# Patient Record
Sex: Male | Born: 1980 | Race: Black or African American | Hispanic: No | Marital: Married | Smoking: Never smoker
Health system: Southern US, Community
[De-identification: ages and names within clinical notes are randomized; demographics above are authoritative.]

## PROBLEM LIST (undated history)

## (undated) DIAGNOSIS — R748 Abnormal levels of other serum enzymes: Secondary | ICD-10-CM

## (undated) DIAGNOSIS — J329 Chronic sinusitis, unspecified: Secondary | ICD-10-CM

---

## 2019-06-26 ENCOUNTER — Ambulatory Visit
Admission: EM | Admit: 2019-06-26 | Discharge: 2019-06-26 | Disposition: A | Discharge status: Home / Self Care | Payer: Commercial Managed Care - PPO

## 2019-06-26 DIAGNOSIS — R591 Generalized enlarged lymph nodes: Secondary | ICD-10-CM | POA: Diagnosis not present

## 2019-06-26 NOTE — ED Triage Notes (Signed)
Pt c/o swollen lymph node to rt side of neck x 2days. Denies pain, states if he takes allergy meds it gets smaller

## 2019-06-26 NOTE — ED Provider Notes (Signed)
[facility]    CSN: [id number] Arrival date & time: 06/26/19  1538     History   Chief Complaint Chief Complaint  Patient presents with  . swollen lymphnode to neck    HPI Rahsaan Weakland is a 38 y.o. male.   38 year old male comes in for 2 day history of swollen lymph node. He denies any pain, warmth, erythema to the area. Denies URI symptoms such as cough, congestion, sore throat. Denies fever, chills, body aches. Denies abdominal pain, nausea, vomiting, diarrhea. Denies loss of taste, smell. States this has happened in the past and he had tonsillitis, so he came in for evaluation. Denies sick contact.      History reviewed. No pertinent past medical history.  There are no active problems to display for this patient.   History reviewed. No pertinent surgical history.     Home Medications    Prior to Admission medications   Not on File    Family History No family history on file.  Social History Social History   Tobacco Use  . Smoking status: Never Smoker  . Smokeless tobacco: Never Used  Substance Use Topics  . Alcohol use: Yes  . Drug use: Not on file     Allergies   Patient has no known allergies.   Review of Systems Review of Systems  Reason unable to perform ROS: See HPI as above.     Physical Exam Triage Vital Signs ED Triage Vitals  Enc Vitals Group     BP 06/26/19 1551 (!) 148/95     Pulse Rate 06/26/19 1551 (!) 55     Resp 06/26/19 1551 18     Temp 06/26/19 1551 98.3 F (36.8 C)     Temp Source 06/26/19 1551 Oral     SpO2 06/26/19 1551 97 %     Weight --      Height --      Head Circumference --      Peak Flow --      Pain Score 06/26/19 1552 0     Pain Loc --      Pain Edu? --      Excl. in [place]? --    No data found.  Updated Vital Signs BP (!) 148/95 (BP Location: Left Arm)   Pulse (!) 55   Temp 98.3 F (36.8 C) (Oral)   Resp 18   SpO2 97%   Physical Exam Constitutional:      General: He is not in  acute distress.    Appearance: Normal appearance. He is not ill-appearing, toxic-appearing or diaphoretic.  HENT:     Head: Normocephalic and atraumatic.     Mouth/Throat:     Mouth: Mucous membranes are moist.     Pharynx: Oropharynx is clear. Uvula midline. No posterior oropharyngeal erythema or uvula swelling.     Tonsils: No tonsillar exudate. 1+ on the right. 1+ on the left.  Neck:     Musculoskeletal: Normal range of motion and neck supple.  Cardiovascular:     Rate and Rhythm: Normal rate and regular rhythm.     Heart sounds: Normal heart sounds. No murmur. No friction rub. No gallop.   Pulmonary:     Effort: Pulmonary effort is normal. No accessory muscle usage, prolonged expiration, respiratory distress or retractions.     Comments: Lungs clear to auscultation without adventitious lung sounds. Lymphadenopathy:     Head:     Right side of head: Tonsillar adenopathy present.  No submental, submandibular, preauricular, posterior auricular or occipital adenopathy.     Left side of head: Tonsillar adenopathy present. No submental, submandibular, preauricular, posterior auricular or occipital adenopathy.     Cervical: No cervical adenopathy.  Neurological:     General: No focal deficit present.     Mental Status: He is alert and oriented to person, place, and time.      UC Treatments / Results  Labs (all labs ordered are listed, but only abnormal results are displayed) Labs Reviewed - No data to display  EKG   Radiology No results found.  Procedures Procedures (including critical care time)  Medications Ordered in UC Medications - No data to display  Initial Impression / Assessment and Plan / UC Course  I have reviewed the triage vital signs and the nursing notes.  Pertinent labs & imaging results that were available during my care of the patient were reviewed by me and considered in my medical decision making (see chart for details).    No alarming signs at this  time. No signs of lymphadenitis. Will have patient monitor for now. Return precautions given. Patient expresses understanding and agrees to plan.  Final Clinical Impressions(s) / UC Diagnoses   Final diagnoses:  Lymphadenopathy    ED Prescriptions    None        Belinda FisherYu, Rogerio Boutelle V, PA-C 06/26/19 1617

## 2019-06-26 NOTE — Discharge Instructions (Signed)
No alarming signs at this time. Continue to monitor symptoms, if develop more cold symptoms such as cough, congestion, body aches, fevers, may need COVID testing. Otherwise, if lymph node resolves in 1-2 months, do not need follow up. If develop more lymph nodes, or lymph node increases in size, follow up with PCP for further evaluation needed.

## 2020-07-19 ENCOUNTER — Encounter: Payer: Self-pay | Admitting: *Deleted

## 2020-07-19 ENCOUNTER — Other Ambulatory Visit: Payer: Self-pay

## 2020-07-19 ENCOUNTER — Ambulatory Visit
Admission: EM | Admit: 2020-07-19 | Discharge: 2020-07-19 | Disposition: A | Discharge status: Home / Self Care | Payer: Commercial Managed Care - PPO

## 2020-07-19 DIAGNOSIS — R059 Cough, unspecified: Secondary | ICD-10-CM

## 2020-07-19 DIAGNOSIS — R05 Cough: Secondary | ICD-10-CM

## 2020-07-19 DIAGNOSIS — J029 Acute pharyngitis, unspecified: Secondary | ICD-10-CM

## 2020-07-19 HISTORY — DX: Chronic sinusitis, unspecified: J32.9

## 2020-07-19 HISTORY — DX: Abnormal levels of other serum enzymes: R74.8

## 2020-07-19 NOTE — ED Triage Notes (Signed)
C/O runny nose, sore throat, cough since yesterday without fever.

## 2020-07-19 NOTE — Discharge Instructions (Signed)
COVID PCR testing ordered. I would like you to quarantine until testing results. You can take over the counter flonase/nasacort to help with nasal congestion/drainage. Tylenol/motrin for pain and fever. Keep hydrated, urine should be clear to pale yellow in color. If experiencing shortness of breath, trouble breathing, go to the emergency department for further evaluation needed.  

## 2020-07-19 NOTE — ED Provider Notes (Signed)
[facility]    CSN: [id number] Arrival date & time: 07/19/20  0086      History   Chief Complaint Chief Complaint  Patient presents with  . Cough  . Sore Throat    HPI Maxwell Anderson is a 39 y.o. male.   39 year old male comes in for 2 day of URI symptoms. Rhinorrhea, sore throat, cough. Denies fever, chills, body aches. Denies abdominal pain, nausea, vomiting, diarrhea. Denies shortness of breath, loss of taste/smell. Never smoker. COVID vaccinated.      Past Medical History:  Diagnosis Date  . Elevated liver enzymes   . Sinusitis     There are no problems to display for this patient.   History reviewed. No pertinent surgical history.     Home Medications    Prior to Admission medications   Medication Sig Start Date End Date Taking? Authorizing Provider  LORATADINE PO Take by mouth.   Yes [provider]  VITAMIN D PO Take by mouth.   Yes [provider]    Family History Family History  Problem Relation Age of Onset  . Healthy Mother     Social History Social History   Tobacco Use  . Smoking status: Never Smoker  . Smokeless tobacco: Never Used  Vaping Use  . Vaping Use: Never used  Substance Use Topics  . Alcohol use: Yes    Comment: occasionally  . Drug use: Not Currently     Allergies   Patient has no known allergies.   Review of Systems Review of Systems  Reason unable to perform ROS: See HPI as above.     Physical Exam Triage Vital Signs ED Triage Vitals [07/19/20 0855]  Enc Vitals Group     BP 120/86     Pulse Rate 75     Resp 16     Temp 97.7 F (36.5 C)     Temp Source Temporal     SpO2 98 %     Weight      Height      Head Circumference      Peak Flow      Pain Score 6     Pain Loc      Pain Edu?      Excl. in [place]?    No data found.  Updated Vital Signs BP 120/86   Pulse 75   Temp 97.7 F (36.5 C) (Temporal)   Resp 16   SpO2 98%   Physical Exam Constitutional:       General: He is not in acute distress.    Appearance: Normal appearance. He is not ill-appearing, toxic-appearing or diaphoretic.  HENT:     Head: Normocephalic and atraumatic.     Mouth/Throat:     Mouth: Mucous membranes are moist.     Pharynx: Oropharynx is clear. Uvula midline. Posterior oropharyngeal erythema present.  Cardiovascular:     Rate and Rhythm: Normal rate and regular rhythm.     Heart sounds: Normal heart sounds. No murmur heard.  No friction rub. No gallop.   Pulmonary:     Effort: Pulmonary effort is normal. No accessory muscle usage, prolonged expiration, respiratory distress or retractions.     Comments: Lungs clear to auscultation without adventitious lung sounds. Musculoskeletal:     Cervical back: Normal range of motion and neck supple.  Neurological:     General: No focal deficit present.     Mental Status: He is alert and oriented to person, place,  and time.    UC Treatments / Results  Labs (all labs ordered are listed, but only abnormal results are displayed) Labs Reviewed  NOVEL CORONAVIRUS, NAA    EKG   Radiology No results found.  Procedures Procedures (including critical care time)  Medications Ordered in UC Medications - No data to display  Initial Impression / Assessment and Plan / UC Course  I have reviewed the triage vital signs and the nursing notes.  Pertinent labs & imaging results that were available during my care of the patient were reviewed by me and considered in my medical decision making (see chart for details).    COVID PCR test ordered. Patient to quarantine until testing results return. No alarming signs on exam. LCTAB. Symptomatic treatment discussed.  Push fluids.  Return precautions given.  Patient expresses understanding and agrees to plan.  Final Clinical Impressions(s) / UC Diagnoses   Final diagnoses:  Cough  Sore throat   ED Prescriptions    None     PDMP not reviewed this encounter.   Belinda Fisher,  PA-C 07/19/20 [phone]

## 2020-07-20 LAB — NOVEL CORONAVIRUS, NAA: SARS-CoV-2, NAA: NOT DETECTED

## 2022-03-18 ENCOUNTER — Other Ambulatory Visit (HOSPITAL_COMMUNITY): Payer: Self-pay | Admitting: Family Medicine

## 2022-03-18 ENCOUNTER — Other Ambulatory Visit: Payer: Self-pay | Admitting: Family Medicine

## 2022-03-18 DIAGNOSIS — M5412 Radiculopathy, cervical region: Secondary | ICD-10-CM

## 2022-03-18 DIAGNOSIS — M545 Low back pain, unspecified: Secondary | ICD-10-CM

## 2022-03-18 DIAGNOSIS — M542 Cervicalgia: Secondary | ICD-10-CM

## 2022-04-13 ENCOUNTER — Ambulatory Visit (HOSPITAL_COMMUNITY)
Admission: RE | Admit: 2022-04-13 | Discharge: 2022-04-13 | Disposition: A | Discharge status: Home / Self Care | Payer: No Typology Code available for payment source | Source: Ambulatory Visit | Attending: Family Medicine | Admitting: Family Medicine

## 2022-04-13 DIAGNOSIS — M545 Low back pain, unspecified: Secondary | ICD-10-CM

## 2022-04-13 DIAGNOSIS — M542 Cervicalgia: Secondary | ICD-10-CM | POA: Diagnosis present

## 2022-04-13 DIAGNOSIS — M5412 Radiculopathy, cervical region: Secondary | ICD-10-CM | POA: Insufficient documentation

## 2022-04-13 IMAGING — MR MR LUMBAR SPINE W/O CM
4 of 5 series · 26 of 48 positions shown · non-contrast
Comparison: No pertinent prior exams available for comparison.

CLINICAL DATA: Low back pain, unspecified back pain laterality,
unspecified chronicity, unspecified whether sciatica present.
Additional history provided by scanning technologist: Patient
reports low back pain radiating down legs.

EXAM:
MRI LUMBAR SPINE WITHOUT CONTRAST
TECHNIQUE: Multiplanar, multisequence MR imaging of the lumbar spine was
performed. No intravenous contrast was administered.

[Series 5: T2 · sagittal · 4.0mm · 0.73mm/px · 7 of 16 slices shown (1 of 2)]
[im 1/16]
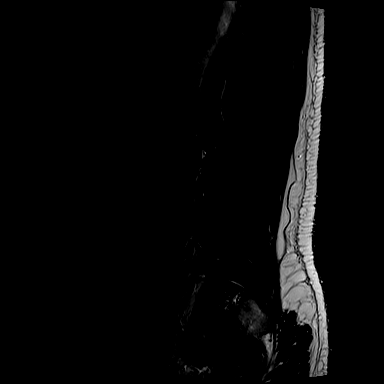
[im 3/16]
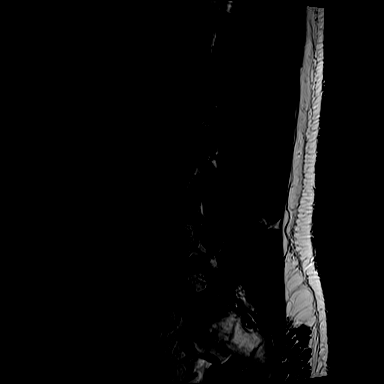
[im 6/16]
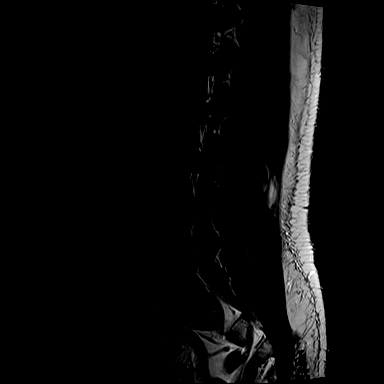
[im 8/16]
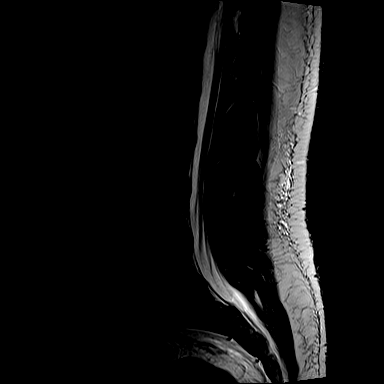
[im 11/16]
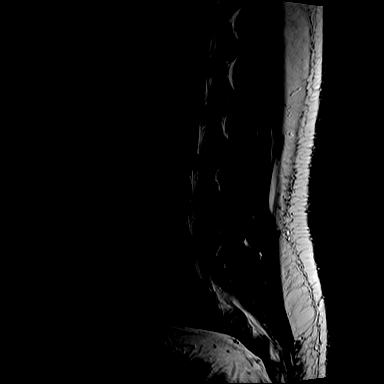
[im 13/16]
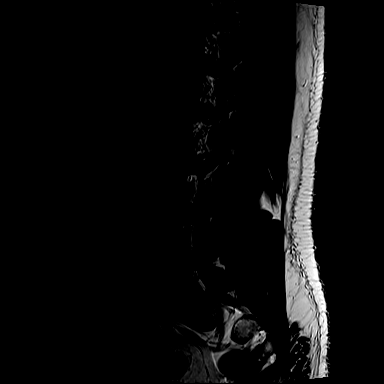
[im 16/16]
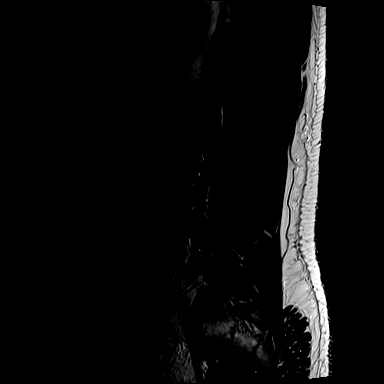

[Series 7: T1 · sagittal · 4.0mm · 0.88mm/px · 6 of 16 slices shown (1 of 2)]
[im 1/16]
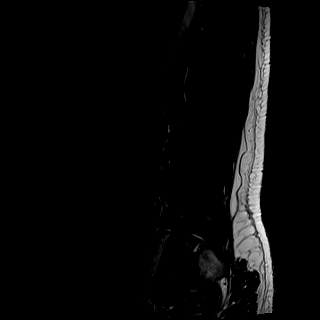
[im 4/16]
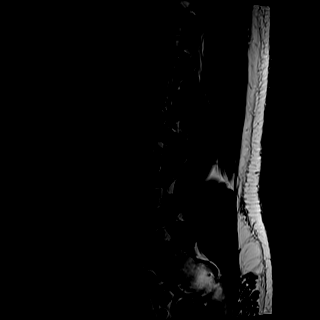
[im 7/16]
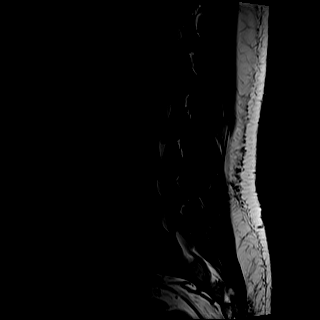
[im 10/16]
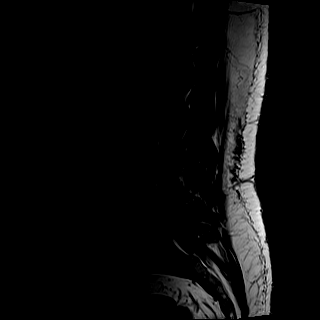
[im 13/16]
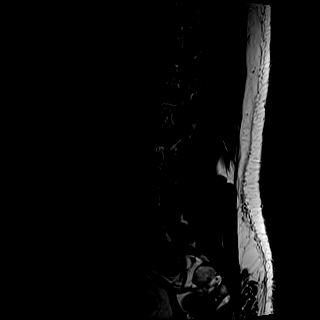
[im 16/16]
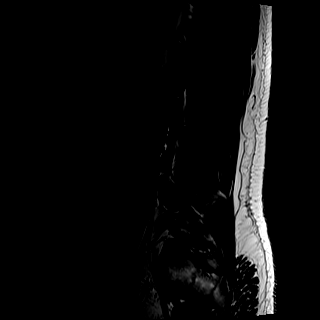

[Series 8: T2 · axial · 4.0mm · 0.57mm/px · z∈[-183,+33]mm · 8 of 36 slices shown (2 of 2)]
[im 1/36]
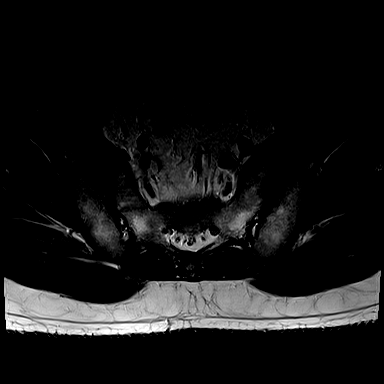
[im 6/36]
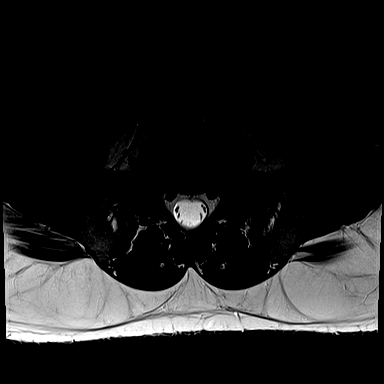
[im 11/36]
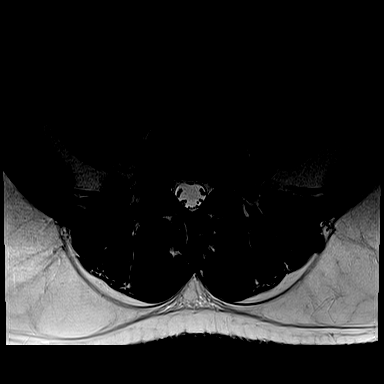
[im 17/36]
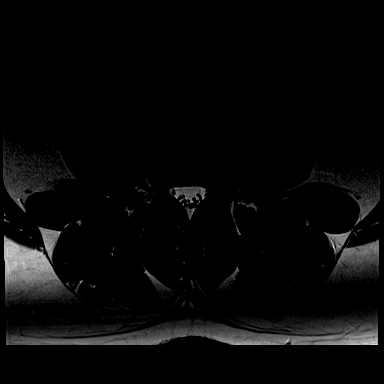
[im 19/36]
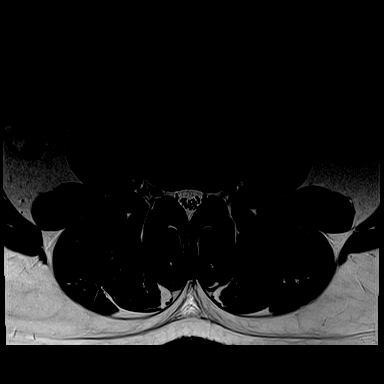
[im 25/36]
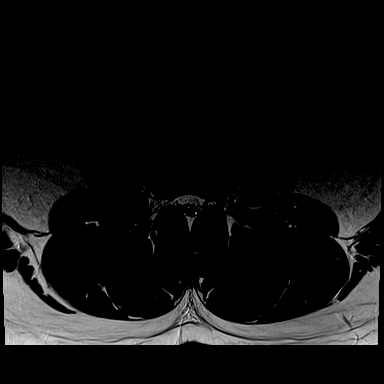
[im 30/36]
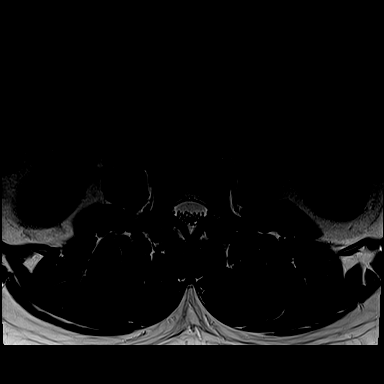
[im 36/36]
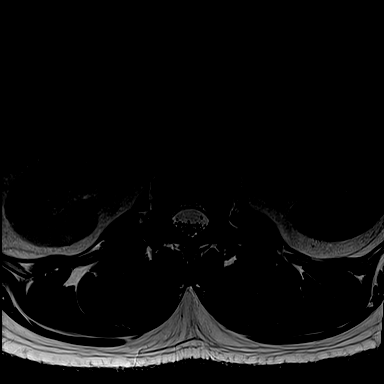

[Series 9: T1 · axial · 4.0mm · 0.34mm/px · z∈[-183,+3]mm · 5 of 36 slices shown (2 of 2)]
[im 1/36]
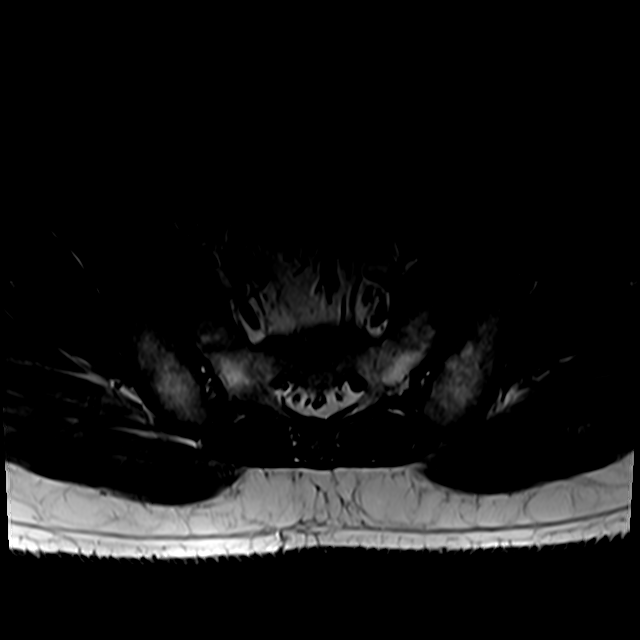
[im 6/36]
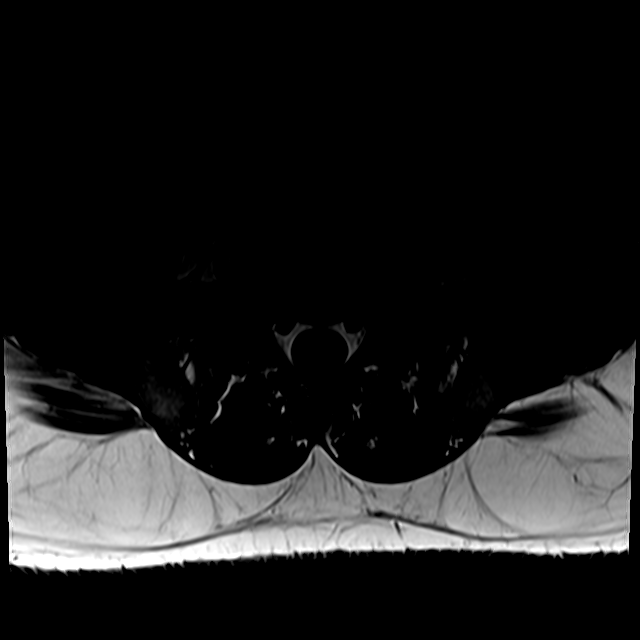
[im 11/36]
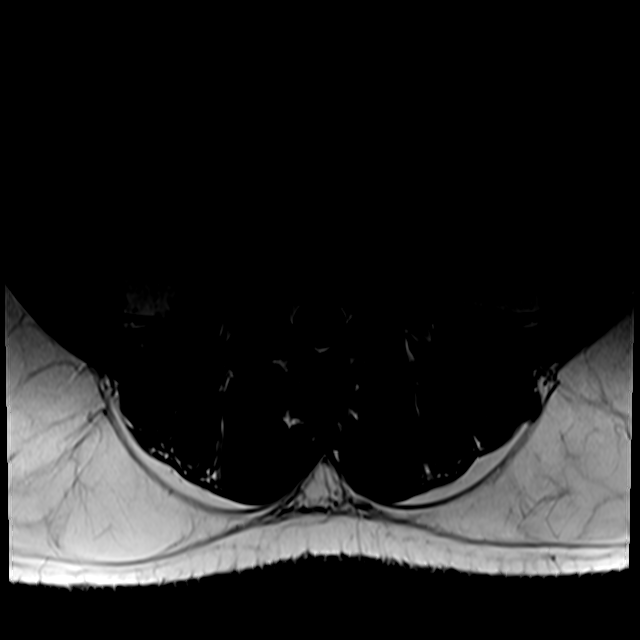
[im 19/36]
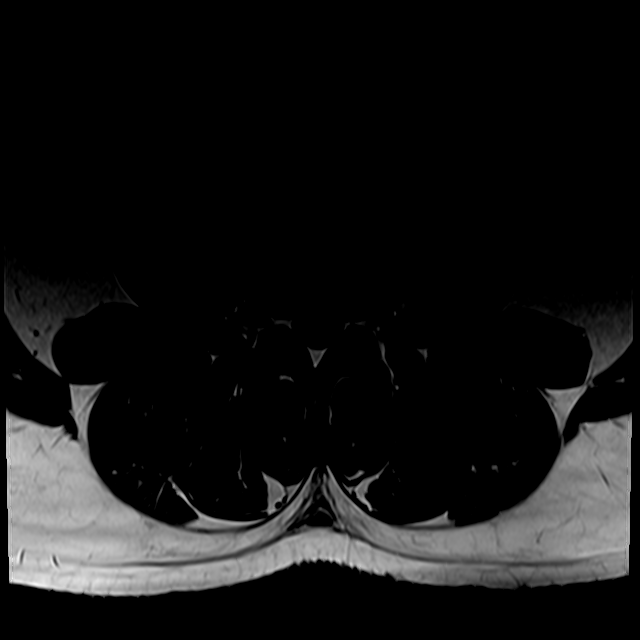
[im 30/36]
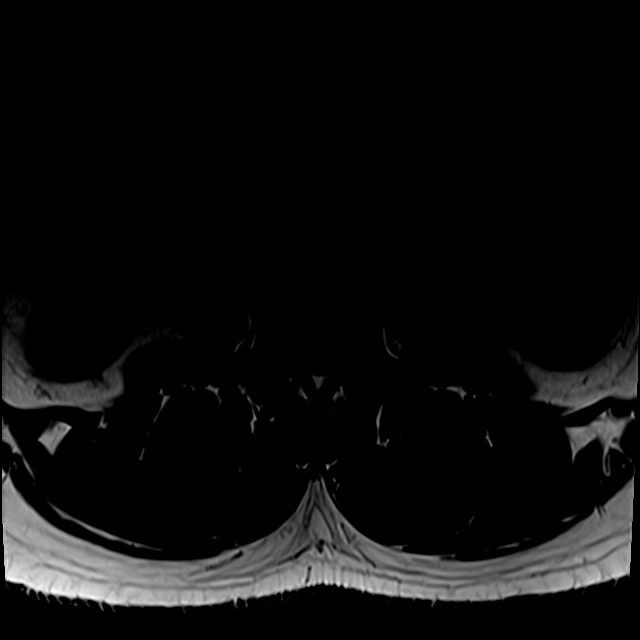

[26 of 48 positions shown; findings below may reference images not displayed]

FINDINGS: Segmentation: Five lumbar vertebrae are assumed and the caudal-most
well-formed intervertebral disc space is designated L5-S1.

Alignment:  No significant spondylolisthesis.

Vertebrae: Vertebral body height is maintained. No significant
marrow edema or focal suspicious osseous lesion.

Conus medullaris and cauda equina: Conus extends to the L1 level. No
signal abnormality within the visualized distal spinal cord.

Paraspinal and other soft tissues: No abnormality identified within
included portions of the abdomen/retroperitoneum. No paraspinal mass
or collection.

Disc levels:

Intervertebral disc height and hydration are preserved throughout
the lumbar spine.

T12-L1: This level is imaged in the sagittal plane only. No
significant disc herniation or stenosis.

L1-L2: Mild facet arthrosis. No significant disc herniation or
stenosis.

L2-L3: Mild facet arthrosis. No significant disc herniation or
stenosis.

L3-L4: Mild facet arthrosis. No significant disc herniation or
stenosis.

L4-L5: Mild facet arthrosis (predominantly on the left). No
significant disc herniation or stenosis.

L5-S1: No significant disc herniation or stenosis.
IMPRESSION: No significant disc degeneration, disc herniation, spinal canal
stenosis or neural foraminal narrowing.

Mild multilevel facet arthrosis, described.

## 2022-04-13 IMAGING — MR MR CERVICAL SPINE W/O CM
5 series · 37 of 48 positions shown · non-contrast
Comparison: No pertinent prior exams available for comparison.

CLINICAL DATA: Provided history: Neck pain. Cervical radiculopathy.
Additional history provided by scanning technologist: Patient
reports chronic migraines, left-sided numbness in arm and fingers.

EXAM:
MRI CERVICAL SPINE WITHOUT CONTRAST
TECHNIQUE: Multiplanar, multisequence MR imaging of the cervical spine was
performed. No intravenous contrast was administered.

[Series 5: T2 · sagittal · 3.0mm · 0.69mm/px · 6 of 15 slices shown (1 of 2)]
[im 1/15]
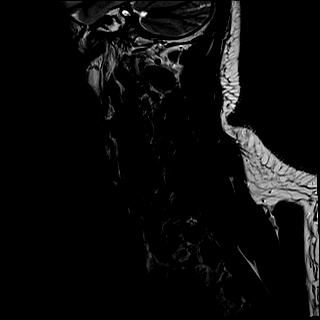
[im 3/15]
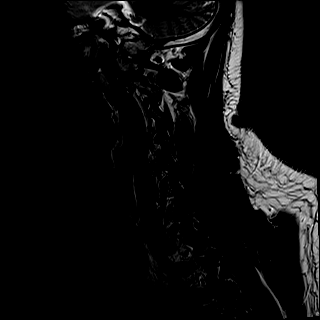
[im 6/15]
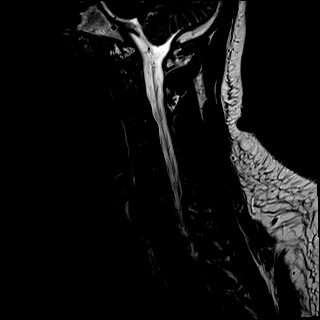
[im 9/15]
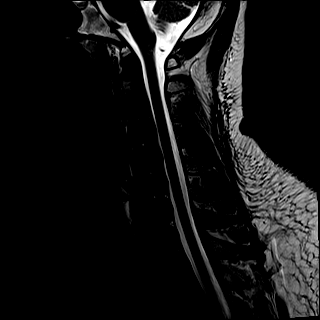
[im 12/15]
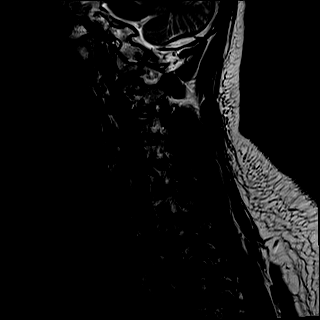
[im 15/15]
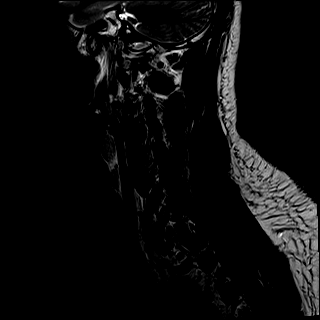

[Series 6: T1 · sagittal · 3.0mm · 0.69mm/px · 6 of 15 slices shown]
[im 1/15]
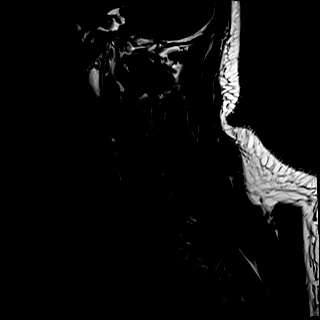
[im 3/15]
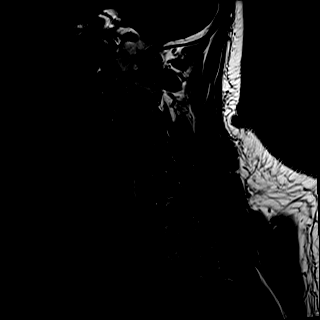
[im 6/15]
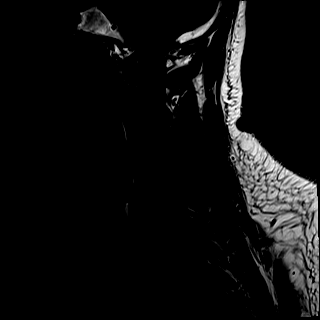
[im 9/15]
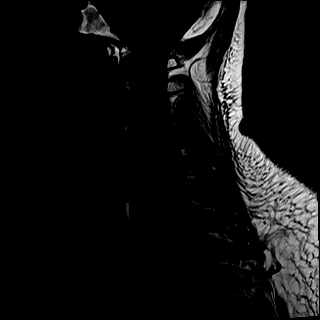
[im 12/15]
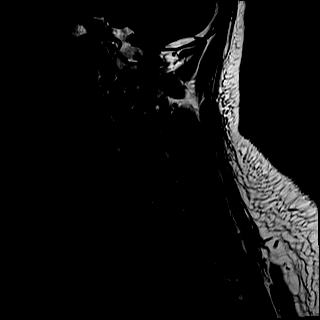
[im 15/15]
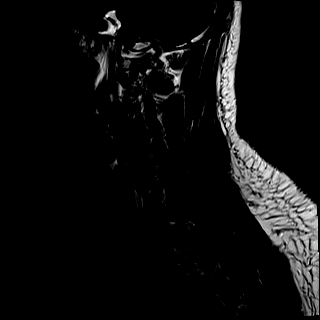

[Series 7: STIR · sagittal · 3.0mm · 0.86mm/px · 6 of 15 slices shown]
[im 1/15]
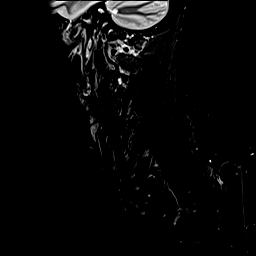
[im 3/15]
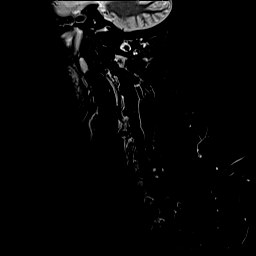
[im 6/15]
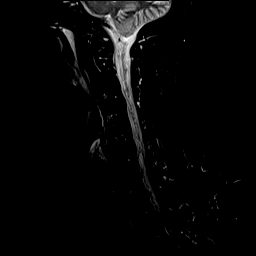
[im 9/15]
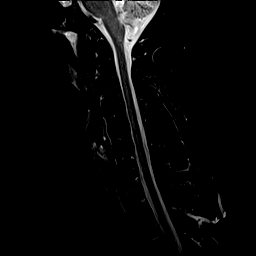
[im 12/15]
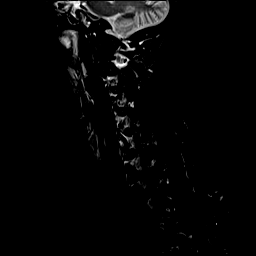
[im 15/15]
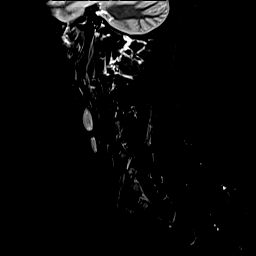

[Series 8: T2 · axial · 3.0mm · 0.66mm/px · z∈[-69,+55]mm · 11 of 40 slices shown (2 of 2)]
[im 1/40]
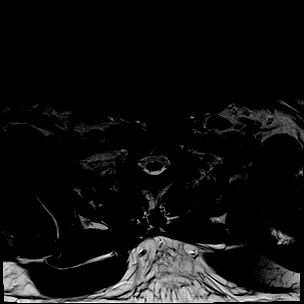
[im 3/40]
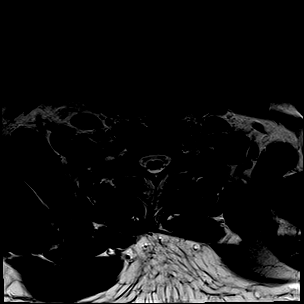
[im 6/40]
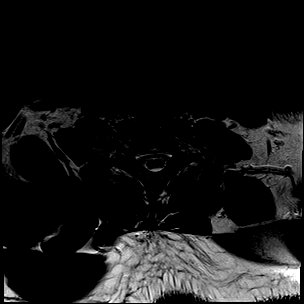
[im 9/40]
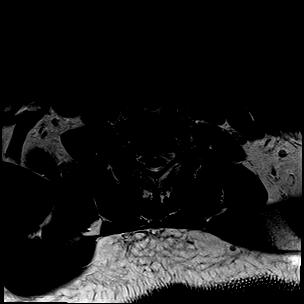
[im 12/40]
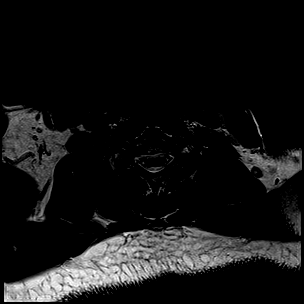
[im 17/40]
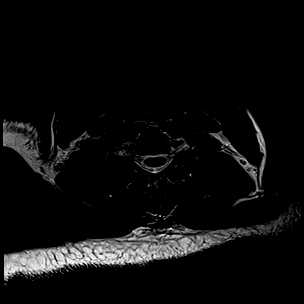
[im 20/40]
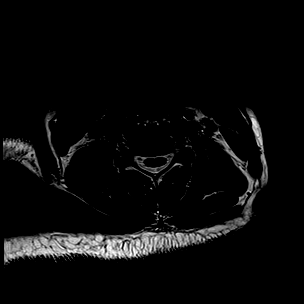
[im 23/40]
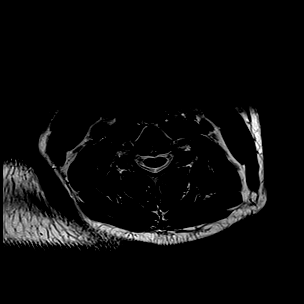
[im 28/40]
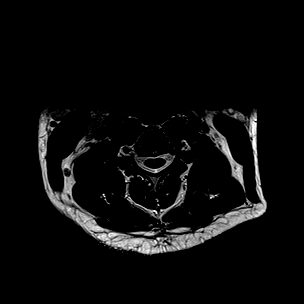
[im 34/40]
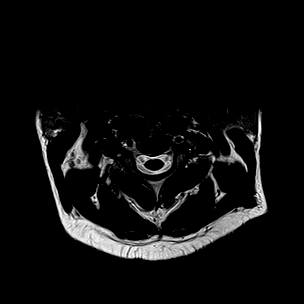
[im 40/40]
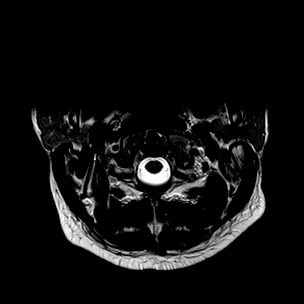

[Series 9: GRE · axial · 3.0mm · 0.39mm/px · z∈[-69,+55]mm · 8 of 40 slices shown]
[im 1/40]
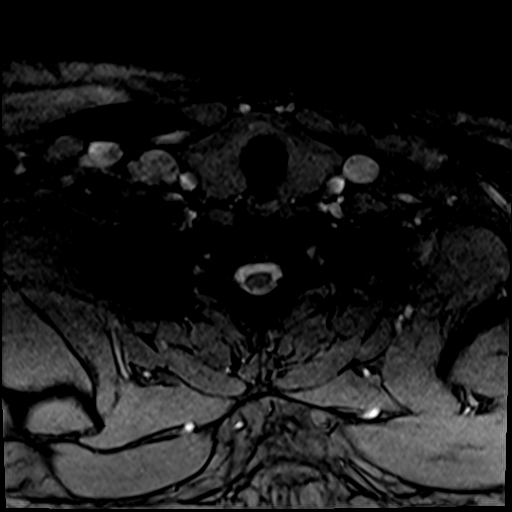
[im 6/40]
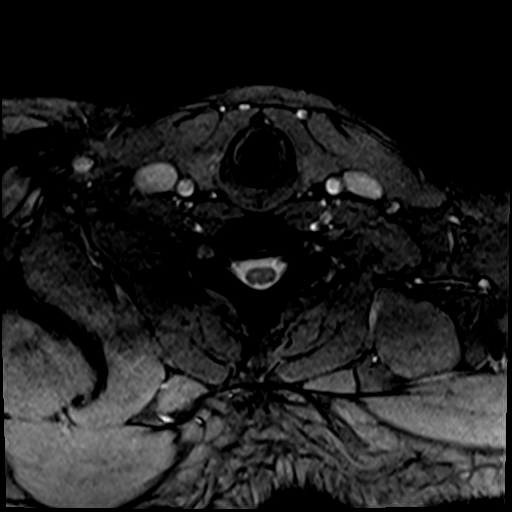
[im 12/40]
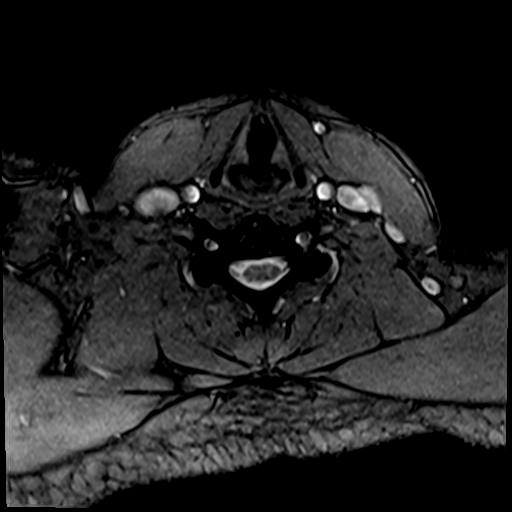
[im 17/40]
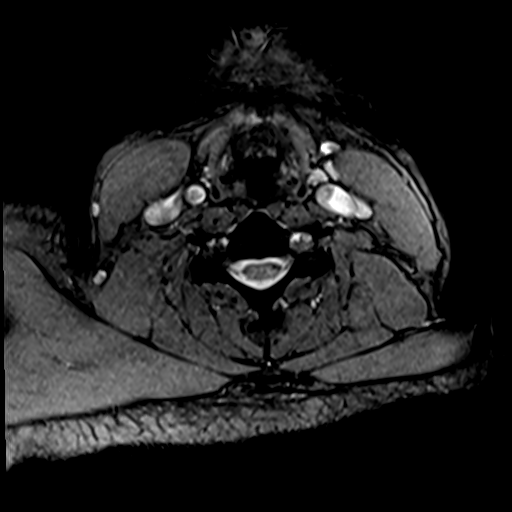
[im 23/40]
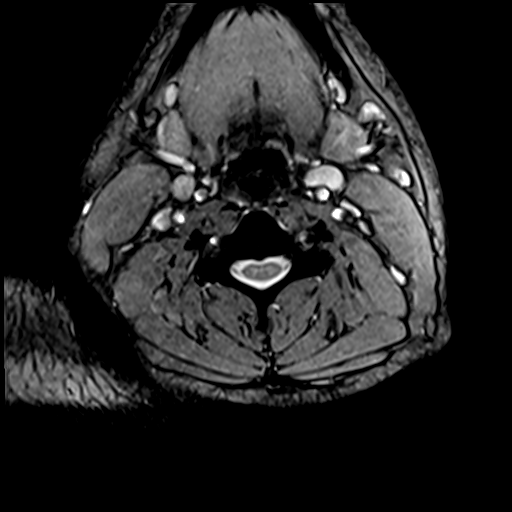
[im 28/40]
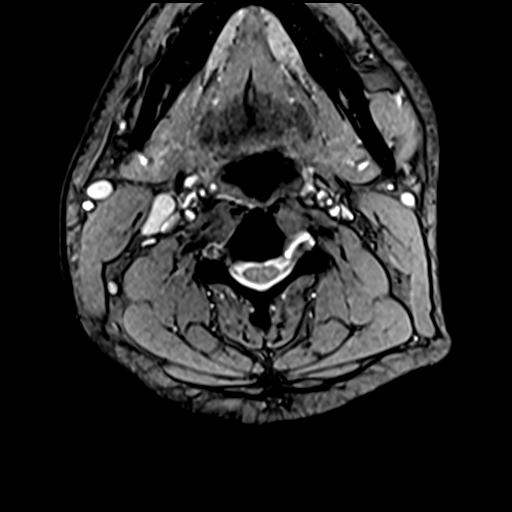
[im 34/40]
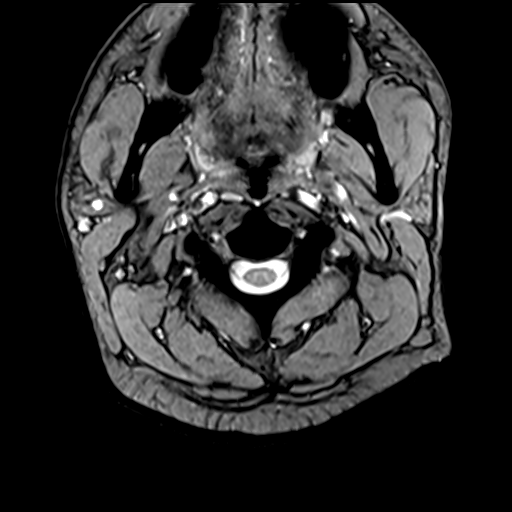
[im 40/40]
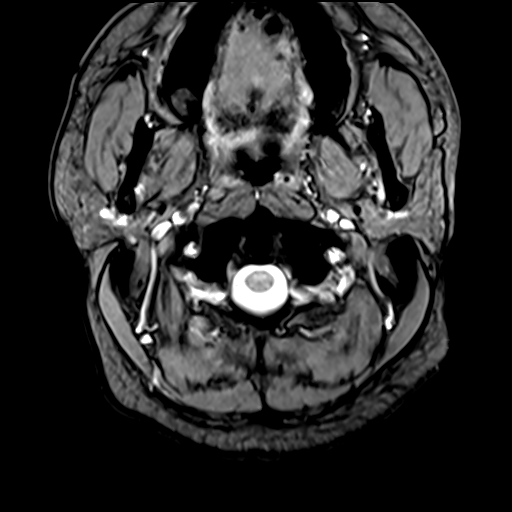

[37 of 48 positions shown; findings below may reference images not displayed]

FINDINGS: Alignment: Straightening of the expected cervical lordosis. No
significant spondylolisthesis.

Vertebrae: Vertebral body height is maintained. No significant
marrow edema or focal suspicious osseous lesion.

Cord: No signal abnormality identified within the cervical spinal
cord.

Posterior Fossa, vertebral arteries, paraspinal tissues: No
abnormality identified within included portions of the posterior
fossa. Flow voids preserved within the imaged cervical vertebral
arteries. No paraspinal mass or collection.

Disc levels:

No more than mild disc degeneration within the cervical spine.

Borderline congenitally narrow cervical spinal canal.

C2-C3: No significant disc herniation or stenosis.

C3-C4: Tiny central disc protrusion. Mild uncovertebral hypertrophy
on the right. No significant spinal canal stenosis. Minimal relative
right neural foraminal narrowing.

C4-C5: No significant disc herniation or stenosis.

C5-C6: No significant disc herniation or stenosis.

C6-C7: Minimal uncovertebral hypertrophy on the right. No
significant disc herniation or stenosis.

C7-T1: Mild facet arthrosis. No significant disc herniation or
stenosis.
IMPRESSION: Mild cervical spondylosis, as outlined. No significant spinal canal
stenosis. At C3-C4, uncovertebral hypertrophy contributes to minimal
relative right neural foraminal narrowing.

Nonspecific straightening of the expected cervical lordosis.

## 2022-10-11 ENCOUNTER — Encounter: Payer: Self-pay | Admitting: Physical Medicine & Rehabilitation

## 2022-12-13 ENCOUNTER — Encounter
Payer: No Typology Code available for payment source | Attending: Physical Medicine & Rehabilitation | Admitting: Physical Medicine & Rehabilitation

## 2024-01-24 ENCOUNTER — Ambulatory Visit
Admission: EM | Admit: 2024-01-24 | Discharge: 2024-01-24 | Disposition: A | Discharge status: Home / Self Care | Attending: Physician Assistant | Admitting: Physician Assistant

## 2024-01-24 DIAGNOSIS — R0981 Nasal congestion: Secondary | ICD-10-CM

## 2024-01-24 DIAGNOSIS — R059 Cough, unspecified: Secondary | ICD-10-CM | POA: Diagnosis present

## 2024-01-24 DIAGNOSIS — B9789 Other viral agents as the cause of diseases classified elsewhere: Secondary | ICD-10-CM | POA: Insufficient documentation

## 2024-01-24 DIAGNOSIS — J069 Acute upper respiratory infection, unspecified: Secondary | ICD-10-CM | POA: Insufficient documentation

## 2024-01-24 DIAGNOSIS — Z1152 Encounter for screening for COVID-19: Secondary | ICD-10-CM | POA: Diagnosis not present

## 2024-01-24 LAB — POCT INFLUENZA A/B
Influenza A, POC: NEGATIVE
Influenza B, POC: NEGATIVE

## 2024-01-24 MED ORDER — PROMETHAZINE-DM 6.25-15 MG/5ML PO SYRP: 5.0000 mL | ORAL_SOLUTION | Freq: Three times a day (TID) | ORAL | 0 refills | Status: AC | PRN

## 2024-01-24 MED ORDER — FLUTICASONE PROPIONATE 50 MCG/ACT NA SUSP: 1.0000 | Freq: Every day | NASAL | 0 refills | Status: AC

## 2024-01-24 NOTE — Discharge Instructions (Signed)
 You tested negative for influenza.  We will contact you if you are positive for COVID.  Monitor your MyChart results.  Make sure that you are resting and drinking plenty of fluid.  Use Promethazine DM for cough.  This will make you sleepy so do not drive drink alcohol with taking it.  Use fluticasone nasal spray for congestion.  I also recommend over-the-counter medications such as Mucinex, Tylenol, ibuprofen, nasal saline/sinus rinses.  If your symptoms are not improving within a week please return for reevaluation.  If anything worsens you need to be seen immediately including high fever, worsening cough, shortness of breath, chest pain, nausea/vomiting interfering with oral intake.

## 2024-01-24 NOTE — ED Triage Notes (Signed)
 Patient here today with c/o nasal congestion, ST, weakness, cough, and little SOB since Sunday. He has been taking Mucinex and Sudafed with some relief. His son was sick 1-2 weeks ago.

## 2024-01-24 NOTE — ED Provider Notes (Signed)
 [facility]    CSN: [id number] Arrival date & time: 01/24/24  8469      History   Chief Complaint Chief Complaint  Patient presents with   Nasal Congestion    HPI Maxwell Anderson is a 43 y.o. male.   Patient presents today with a 3 to 4-day history of URI symptoms.  Reports sore throat, cough, congestion, body aches.  Denies any chest pain, shortness of breath, fever, chills, GI symptoms.  He reports his son was sick several weeks ago but he attributed this to his son's history of seasonal allergies.  Denies additional sick contacts but has been around many people.  He has had COVID with last episode in 2023.  He has had COVID-19 vaccines as well as influenza vaccination.  He has been taking Mucinex D with temporary improvement of symptoms.  Denies any recent antibiotics or steroids.  He is eating and drinking normally.  Denies any history of allergies, asthma, COPD, smoking.    Past Medical History:  Diagnosis Date   Elevated liver enzymes    Sinusitis     There are no active problems to display for this patient.   History reviewed. No pertinent surgical history.     Home Medications    Prior to Admission medications   Medication Sig Start Date End Date Taking? Authorizing Provider  fluticasone (FLONASE) 50 MCG/ACT nasal spray Place 1 spray into both nostrils daily. 01/24/24  Yes Darrek Leasure K, PA-C  naproxen (NAPROSYN) 500 MG tablet Take 500 mg by mouth as needed for moderate pain (pain score 4-6). 09/05/23  Yes [provider]  promethazine-dextromethorphan (PROMETHAZINE-DM) 6.25-15 MG/5ML syrup Take 5 mLs by mouth 3 (three) times daily as needed for cough. 01/24/24  Yes Devan Babino K, PA-C  propranolol ER (INDERAL LA) 60 MG 24 hr capsule Take 1 capsule by mouth daily. 11/29/23  Yes [provider]  SUMAtriptan (IMITREX) 100 MG tablet Take 100 mg by mouth as needed for headache. 09/05/23  Yes [provider]  zinc sulfate, 50mg   elemental zinc, 220 (50 Zn) MG capsule Take 1 capsule by mouth daily. 04/14/21  Yes [provider]  LORATADINE PO Take by mouth.    [provider]  VITAMIN D PO Take by mouth.    [provider]    Family History Family History  Problem Relation Age of Onset   Healthy Mother     Social History Social History   Tobacco Use   Smoking status: Never   Smokeless tobacco: Never  Vaping Use   Vaping status: Never Used  Substance Use Topics   Alcohol use: Yes    Comment: occasionally   Drug use: Not Currently     Allergies   Patient has no known allergies.   Review of Systems Review of Systems  Constitutional:  Positive for activity change and fatigue. Negative for appetite change, chills and fever.  HENT:  Positive for congestion and sore throat. Negative for sinus pressure and sneezing.   Respiratory:  Positive for cough. Negative for shortness of breath.   Cardiovascular:  Negative for chest pain.  Gastrointestinal:  Negative for abdominal pain, diarrhea, nausea and vomiting.  Musculoskeletal:  Positive for arthralgias and myalgias.  Neurological:  Positive for headaches. Negative for dizziness and light-headedness.     Physical Exam Triage Vital Signs ED Triage Vitals  Encounter Vitals Group     BP 01/24/24 1028 121/86     Systolic BP Percentile --  Diastolic BP Percentile --      Pulse Rate 01/24/24 1028 78     Resp 01/24/24 1028 16     Temp 01/24/24 1028 97.6 F (36.4 C)     Temp Source 01/24/24 1028 Oral     SpO2 01/24/24 1028 97 %     Weight 01/24/24 1029 240 lb (108.9 kg)     Height 01/24/24 1029 6' (1.829 m)     Head Circumference --      Peak Flow --      Pain Score 01/24/24 1029 5     Pain Loc --      Pain Education --      Exclude from Growth Chart --    No data found.  Updated Vital Signs BP 121/86 (BP Location: Right Arm)   Pulse 78   Temp 97.6 F (36.4 C) (Oral)   Resp 16   Ht 6' (1.829 m)   Wt 240 lb  (108.9 kg)   SpO2 97%   BMI 32.55 kg/m   Visual Acuity Right Eye Distance:   Left Eye Distance:   Bilateral Distance:    Right Eye Near:   Left Eye Near:    Bilateral Near:     Physical Exam Vitals reviewed.  Constitutional:      General: He is awake.     Appearance: Normal appearance. He is well-developed. He is not ill-appearing.     Comments: Very pleasant male appears stated age in no acute distress sitting comfortably in exam room  HENT:     Head: Normocephalic and atraumatic.     Right Ear: Tympanic membrane, ear canal and external ear normal. Tympanic membrane is not erythematous or bulging.     Left Ear: Tympanic membrane, ear canal and external ear normal. Tympanic membrane is not erythematous or bulging.     Nose: Nose normal.     Mouth/Throat:     Pharynx: Uvula midline. Posterior oropharyngeal erythema and postnasal drip present. No oropharyngeal exudate or uvula swelling.  Cardiovascular:     Rate and Rhythm: Normal rate and regular rhythm.     Heart sounds: Normal heart sounds, S1 normal and S2 normal. No murmur heard. Pulmonary:     Effort: Pulmonary effort is normal. No accessory muscle usage or respiratory distress.     Breath sounds: Normal breath sounds. No stridor. No wheezing, rhonchi or rales.     Comments: Clear to auscultation bilaterally Neurological:     Mental Status: He is alert.  Psychiatric:        Behavior: Behavior is cooperative.      UC Treatments / Results  Labs (all labs ordered are listed, but only abnormal results are displayed) Labs Reviewed  POCT INFLUENZA A/B - Normal  SARS CORONAVIRUS 2 (TAT 6-24 HRS)    EKG   Radiology No results found.  Procedures Procedures (including critical care time)  Medications Ordered in UC Medications - No data to display  Initial Impression / Assessment and Plan / UC Course  I have reviewed the triage vital signs and the nursing notes.  Pertinent labs & imaging results that were  available during my care of the patient were reviewed by me and considered in my medical decision making (see chart for details).     Patient is well-appearing, afebrile, nontoxic, nontachycardic.  No evidence of acute infection on physical exam that would warrant initiation of antibiotics.  Discussed likely viral etiology.  He tested negative for influenza.  COVID testing was  obtained and is pending.  We will contact him if this is positive.  He is not a candidate for antiviral therapy as he is young and otherwise healthy without significant comorbidities and by the time we receive results he will be outside the window of effectiveness for antiviral therapy.  Will treat symptomatically and he was given Promethazine DM for cough.  We discussed that this can be sedating and he is not to drive or drink alcohol with taking it.  He was given fluticasone nasal spray to help with congestion.  Recommended to use over-the-counter medication.  If his symptoms are not improving within a week he is to return for reevaluation.  If anything worsens he needs to be seen immediately.  Strict return precautions given.  Excuse note provided.  All questions answered to patient satisfaction.  Final Clinical Impressions(s) / UC Diagnoses   Final diagnoses:  Viral URI with cough  Nasal congestion     Discharge Instructions      You tested negative for influenza.  We will contact you if you are positive for COVID.  Monitor your MyChart results.  Make sure that you are resting and drinking plenty of fluid.  Use Promethazine DM for cough.  This will make you sleepy so do not drive drink alcohol with taking it.  Use fluticasone nasal spray for congestion.  I also recommend over-the-counter medications such as Mucinex, Tylenol, ibuprofen, nasal saline/sinus rinses.  If your symptoms are not improving within a week please return for reevaluation.  If anything worsens you need to be seen immediately including high fever,  worsening cough, shortness of breath, chest pain, nausea/vomiting interfering with oral intake.     ED Prescriptions     Medication Sig Dispense Auth. Provider   fluticasone (FLONASE) 50 MCG/ACT nasal spray Place 1 spray into both nostrils daily. 16 g Jaqwan Wieber K, PA-C   promethazine-dextromethorphan (PROMETHAZINE-DM) 6.25-15 MG/5ML syrup Take 5 mLs by mouth 3 (three) times daily as needed for cough. 118 mL Happy Ky K, PA-C      PDMP not reviewed this encounter.   Jeani Hawking, PA-C 01/24/24 1141

## 2024-01-25 LAB — SARS CORONAVIRUS 2 (TAT 6-24 HRS): SARS Coronavirus 2: NEGATIVE
